# Patient Record
Sex: Male | Born: 1990 | Race: White | Hispanic: No | Marital: Single | Smoking: Never smoker
Health system: Southern US, Community
[De-identification: ages and names within clinical notes are randomized; demographics above are authoritative.]

## PROBLEM LIST (undated history)

## (undated) DIAGNOSIS — J302 Other seasonal allergic rhinitis: Secondary | ICD-10-CM

## (undated) HISTORY — PX: TYMPANOSTOMY TUBE PLACEMENT: SHX32

---
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
## 2012-12-12 IMAGING — CT CT KNEE*L* W/O CM
3 of 4 series · 12 of 27 positions shown, 14 images · non-contrast
Comparison: None.

CLINICAL DATA: Left knee pain for several months.

CT OF THE LEFT KNEE WITHOUT CONTRAST
TECHNIQUE: Multidetector CT imaging was performed according to the
standard protocol. Multiplanar CT image reconstructions were also
generated.

[Series 2: knee bone · axial · 0.33mm/px · z∈[-80,+40]mm · 4 of 80 slices shown, 5 images]
[im 16/80  soft-tissue]
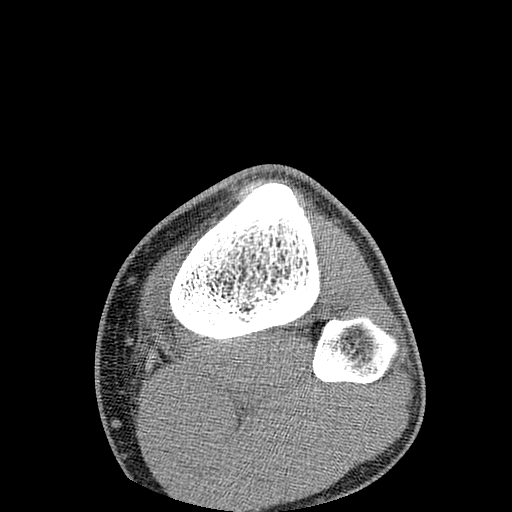
[im 16/80  bone]
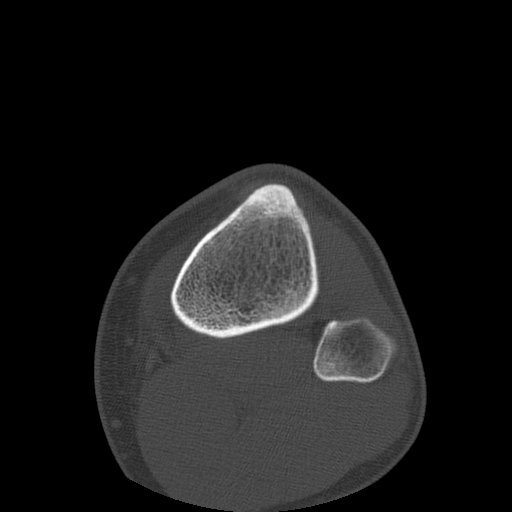
[im 32/80  bone]
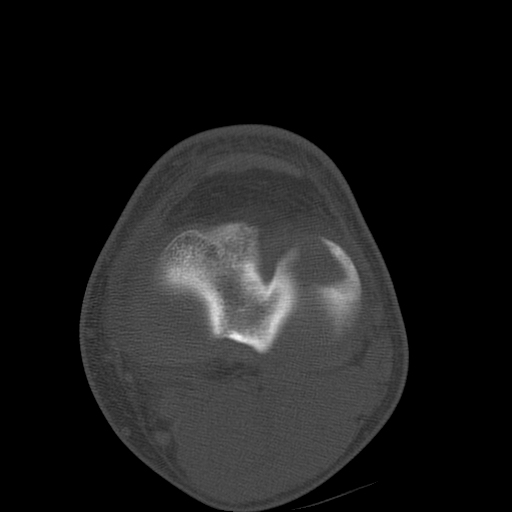
[im 48/80  bone]
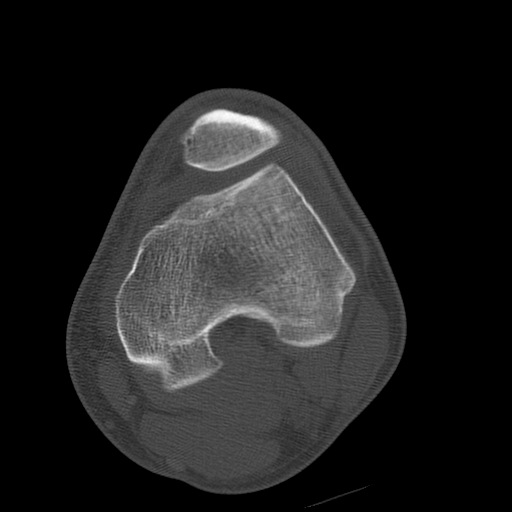
[im 64/80  bone]
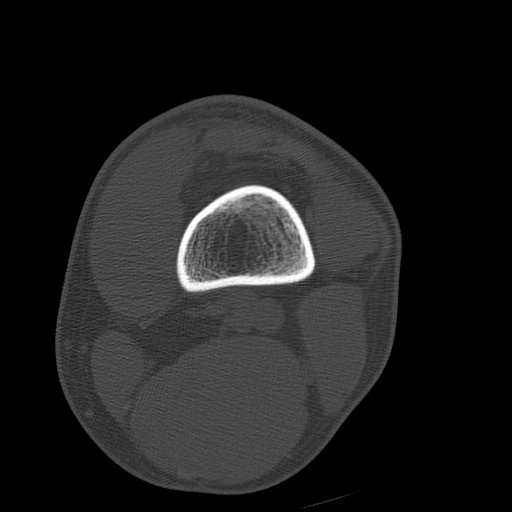

[Series 3: knee detail · axial · 0.33mm/px · z∈[-70,+30]mm · 3 of 80 slices shown]
[im 20/80  bone]
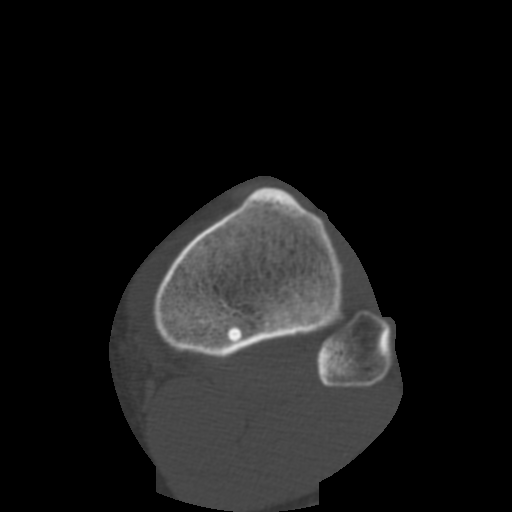
[im 40/80  bone]
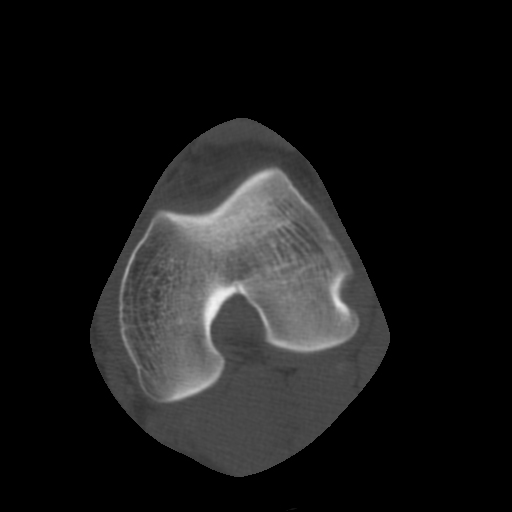
[im 60/80  bone]
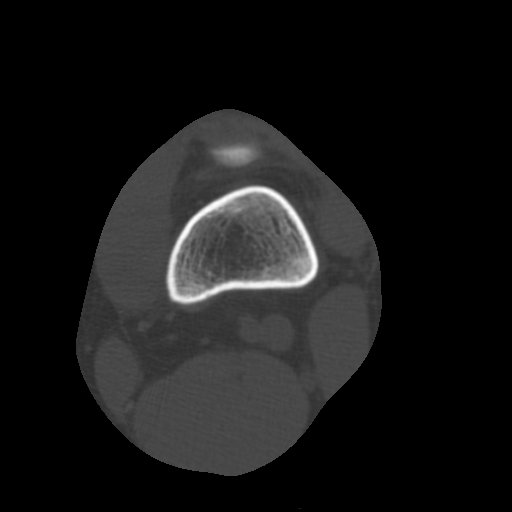

[Series 401: sagittal · sagittal · 0.39mm/px · 5 of 56 slices shown, 6 images]
[im 19/56  bone]
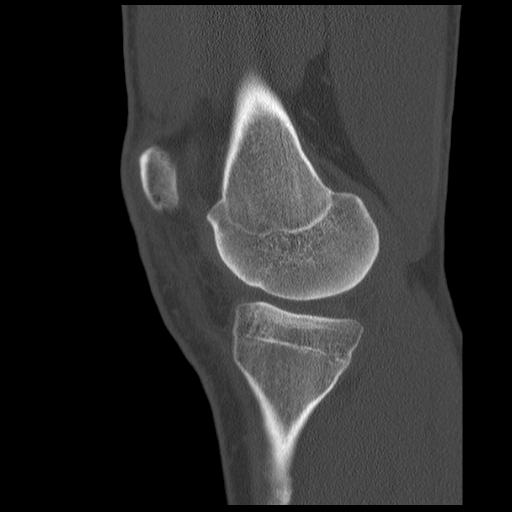
[im 23/56  bone]
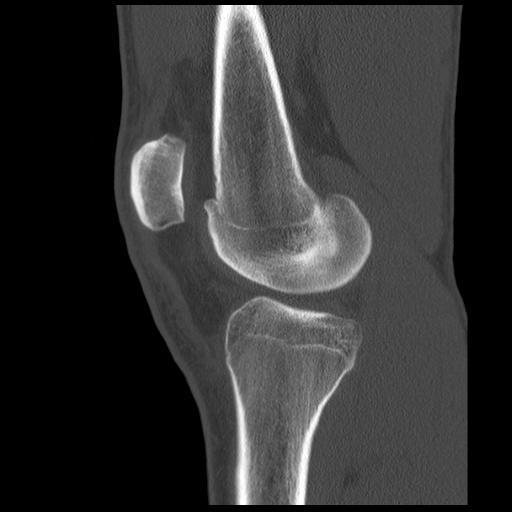
[im 28/56  soft-tissue]
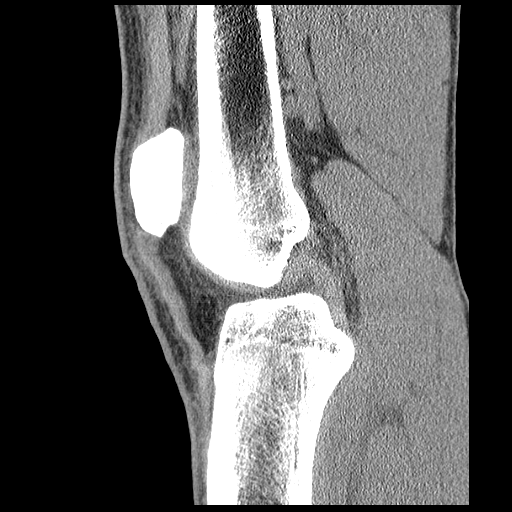
[im 28/56  bone]
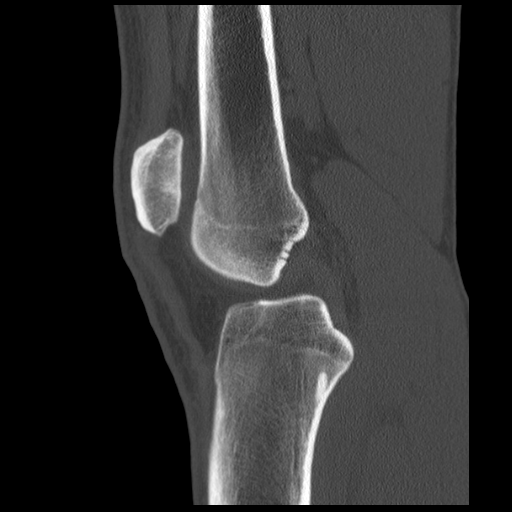
[im 33/56  bone]
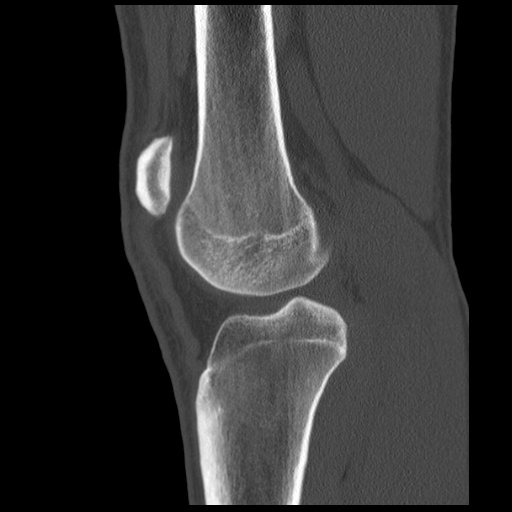
[im 37/56  bone]
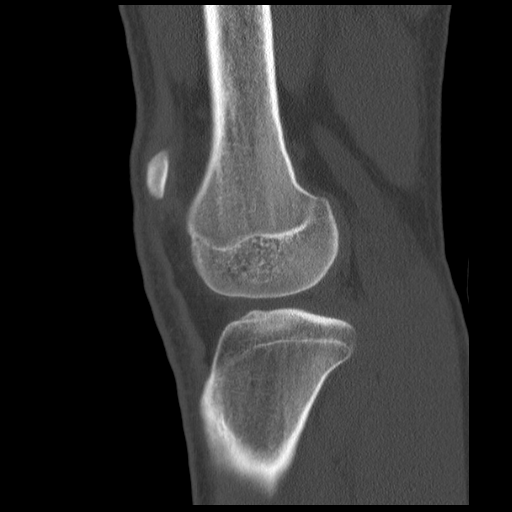

[12 of 27 positions shown; findings below may reference images not displayed]

FINDINGS: There is a die punch type fracture of the anterior aspect
of the lateral tibial plateau. The involved area is 12 x 14 mm.
Maximum depression is 5 mm.

The other osseous structures are normal.  Ligaments are not well
enough seen be evaluated.  Distal quadriceps tendon and patellar
tendon are intact. Tiny joint effusion.
IMPRESSION: Die punch type fracture of the anterior aspect of the lateral
tibial plateau with 5 mm of depression.

## 2013-10-29 ENCOUNTER — Emergency Department (HOSPITAL_BASED_OUTPATIENT_CLINIC_OR_DEPARTMENT_OTHER)
Admission: EM | Admit: 2013-10-29 | Discharge: 2013-10-29 | Disposition: A | Discharge status: Home / Self Care | Payer: 59 | Attending: Emergency Medicine | Admitting: Emergency Medicine

## 2013-10-29 ENCOUNTER — Encounter (HOSPITAL_BASED_OUTPATIENT_CLINIC_OR_DEPARTMENT_OTHER): Payer: Self-pay | Admitting: Emergency Medicine

## 2013-10-29 DIAGNOSIS — S0181XA Laceration without foreign body of other part of head, initial encounter: Secondary | ICD-10-CM

## 2013-10-29 DIAGNOSIS — IMO0002 Reserved for concepts with insufficient information to code with codable children: Secondary | ICD-10-CM | POA: Insufficient documentation

## 2013-10-29 DIAGNOSIS — Y9389 Activity, other specified: Secondary | ICD-10-CM | POA: Insufficient documentation

## 2013-10-29 DIAGNOSIS — S0180XA Unspecified open wound of other part of head, initial encounter: Secondary | ICD-10-CM | POA: Insufficient documentation

## 2013-10-29 DIAGNOSIS — Z79899 Other long term (current) drug therapy: Secondary | ICD-10-CM | POA: Insufficient documentation

## 2013-10-29 DIAGNOSIS — W268XXA Contact with other sharp object(s), not elsewhere classified, initial encounter: Secondary | ICD-10-CM | POA: Insufficient documentation

## 2013-10-29 DIAGNOSIS — Y929 Unspecified place or not applicable: Secondary | ICD-10-CM | POA: Insufficient documentation

## 2013-10-29 HISTORY — DX: Other seasonal allergic rhinitis: J30.2

## 2013-10-29 NOTE — Discharge Instructions (Signed)
Thank you for allowing me to take care of you today. Try and keep the laceration clean and dry for the next 4 days. After the wound heals be sure to use sun screen to prevent worse scaring.  Facial Laceration A facial laceration is a cut on the face. These injuries can be painful and cause bleeding. Some cuts may need to be closed with stitches (sutures), skin adhesive strips, or wound glue. Cuts usually heal quickly but can leave a scar. It can take 1 2 years for the scar to go away completely. HOME CARE   Only take medicines as told by your doctor.  Follow your doctor's instructions for wound care. For Stitches:  Keep the cut clean and dry.  If you have a bandage (dressing), change it at least once a day. Change the bandage if it gets wet or dirty, or as told by your doctor.  Wash the cut with soap and water 2 times a day. Rinse the cut with water. Pat it dry with a clean towel.  Put a thin layer of medicated cream on the cut as told by your doctor.  You may shower after the first 24 hours. Do not soak the cut in water until the stitches are removed.  Have your stitches removed as told by your doctor.  Do not wear any makeup until a few days after your stitches are removed. For Skin Adhesive Strips:  Keep the cut clean and dry.  Do not get the strips wet. You may take a bath, but be careful to keep the cut dry.  If the cut gets wet, pat it dry with a clean towel.  The strips will fall off on their own. Do not remove the strips that are still stuck to the cut. For Wound Glue:  You may shower or take baths. Do not soak or scrub the cut. Do not swim. Avoid heavy sweating until the glue falls off on its own. After a shower or bath, pat the cut dry with a clean towel.  Do not put medicine or makeup on your cut until the glue falls off.  If you have a bandage, do not put tape over the glue.  Avoid lots of sunlight or tanning lamps until the glue falls off.  The glue will fall  off on its own in 5 10 days. Do not pick at the glue. After Healing: Put sunscreen on the cut for the first year to reduce your scar. GET HELP RIGHT AWAY IF:   Your cut area gets red, painful, or puffy (swollen).  You see a yellowish-white fluid (pus) coming from the cut.  You have chills or a fever. MAKE SURE YOU:   Understand these instructions.  Will watch your condition.  Will get help right away if you are not doing well or get worse. Document Released: 01/28/2008 Document Revised: 06/01/2013 Document Reviewed: 03/24/2013 [facility] Patient Information 2014 [employer], [place].

## 2013-10-29 NOTE — ED Provider Notes (Signed)
Medical screening examination/treatment/procedure(s) were performed by non-physician practitioner and as supervising physician I was immediately available for consultation/collaboration.   EKG Interpretation None       Doug SouSam Cheyanne Lamison, MD 10/29/13 2250
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
## 2013-10-29 NOTE — ED Notes (Signed)
Suture cart is at the bedside set up and ready for the doctor to use.
